# Patient Record
Sex: Male | Born: 1990 | Race: Black or African American | Hispanic: No | Marital: Single | Smoking: Never smoker
Health system: Southern US, Community
[De-identification: ages and names within clinical notes are randomized; demographics above are authoritative.]

## PROBLEM LIST (undated history)

## (undated) DIAGNOSIS — J302 Other seasonal allergic rhinitis: Secondary | ICD-10-CM

## (undated) HISTORY — PX: HERNIA REPAIR: SHX51

---
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
## 2010-10-27 LAB — COMPREHENSIVE METABOLIC PANEL
AST: 25
Albumin: 4
BUN: 7
Chloride: 104
Creatinine, Ser: 0.87
Potassium: 4
Total Protein: 7.1

## 2010-10-27 LAB — URINALYSIS, ROUTINE W REFLEX MICROSCOPIC
Bilirubin Urine: NEGATIVE
Glucose, UA: NEGATIVE
Hgb urine dipstick: NEGATIVE
Specific Gravity, Urine: 1.008

## 2013-10-15 ENCOUNTER — Emergency Department (HOSPITAL_COMMUNITY)
Admission: EM | Admit: 2013-10-15 | Discharge: 2013-10-15 | Disposition: A | Discharge status: Home / Self Care | Payer: No Typology Code available for payment source | Attending: Emergency Medicine | Admitting: Emergency Medicine

## 2013-10-15 ENCOUNTER — Encounter (HOSPITAL_COMMUNITY): Payer: Self-pay | Admitting: Emergency Medicine

## 2013-10-15 DIAGNOSIS — Y92410 Unspecified street and highway as the place of occurrence of the external cause: Secondary | ICD-10-CM | POA: Diagnosis not present

## 2013-10-15 DIAGNOSIS — S199XXA Unspecified injury of neck, initial encounter: Secondary | ICD-10-CM | POA: Insufficient documentation

## 2013-10-15 DIAGNOSIS — Y9389 Activity, other specified: Secondary | ICD-10-CM | POA: Insufficient documentation

## 2013-10-15 DIAGNOSIS — S60311A Abrasion of right thumb, initial encounter: Secondary | ICD-10-CM | POA: Diagnosis not present

## 2013-10-15 DIAGNOSIS — M542 Cervicalgia: Secondary | ICD-10-CM

## 2013-10-15 DIAGNOSIS — S0990XA Unspecified injury of head, initial encounter: Secondary | ICD-10-CM | POA: Diagnosis not present

## 2013-10-15 DIAGNOSIS — G4489 Other headache syndrome: Secondary | ICD-10-CM

## 2013-10-15 HISTORY — DX: Other seasonal allergic rhinitis: J30.2

## 2013-10-15 MED ORDER — HYDROCODONE-ACETAMINOPHEN 5-325 MG PO TABS: 1.0000 | ORAL_TABLET | ORAL | Status: DC | PRN

## 2013-10-15 MED ORDER — METHOCARBAMOL 500 MG PO TABS: 500.0000 mg | ORAL_TABLET | Freq: Two times a day (BID) | ORAL | Status: DC
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
## 2013-10-15 NOTE — Discharge Instructions (Signed)
Take the prescribed medication as directed.  Use caution with vicodin, may make you drowsy. Return to the ED for new or worsening symptoms.

## 2013-10-18 NOTE — ED Provider Notes (Signed)
Medical screening examination/treatment/procedure(s) were performed by non-physician practitioner and as supervising physician I was immediately available for consultation/collaboration.   EKG Interpretation None        Matthew Gentry, MD 10/18/13 0705 

## 2014-05-30 ENCOUNTER — Ambulatory Visit: Payer: Self-pay | Admitting: Family

## 2014-05-30 DIAGNOSIS — Z0289 Encounter for other administrative examinations: Secondary | ICD-10-CM

## 2017-12-18 ENCOUNTER — Encounter (HOSPITAL_COMMUNITY): Payer: Self-pay | Admitting: Emergency Medicine

## 2017-12-18 ENCOUNTER — Emergency Department (HOSPITAL_COMMUNITY): Payer: BLUE CROSS/BLUE SHIELD

## 2017-12-18 ENCOUNTER — Emergency Department (HOSPITAL_COMMUNITY)
Admission: EM | Admit: 2017-12-18 | Discharge: 2017-12-18 | Disposition: A | Discharge status: Home / Self Care | Payer: BLUE CROSS/BLUE SHIELD | Attending: Emergency Medicine | Admitting: Emergency Medicine

## 2017-12-18 DIAGNOSIS — Y92410 Unspecified street and highway as the place of occurrence of the external cause: Secondary | ICD-10-CM | POA: Diagnosis not present

## 2017-12-18 DIAGNOSIS — Y999 Unspecified external cause status: Secondary | ICD-10-CM | POA: Insufficient documentation

## 2017-12-18 DIAGNOSIS — S6991XA Unspecified injury of right wrist, hand and finger(s), initial encounter: Secondary | ICD-10-CM | POA: Diagnosis present

## 2017-12-18 DIAGNOSIS — S62291A Other fracture of first metacarpal bone, right hand, initial encounter for closed fracture: Secondary | ICD-10-CM | POA: Diagnosis not present

## 2017-12-18 DIAGNOSIS — S0031XA Abrasion of nose, initial encounter: Secondary | ICD-10-CM | POA: Insufficient documentation

## 2017-12-18 DIAGNOSIS — H53143 Visual discomfort, bilateral: Secondary | ICD-10-CM | POA: Diagnosis not present

## 2017-12-18 DIAGNOSIS — Y9389 Activity, other specified: Secondary | ICD-10-CM | POA: Diagnosis not present

## 2017-12-18 MED ORDER — HYDROCODONE-ACETAMINOPHEN 5-325 MG PO TABS: 1.0000 | ORAL_TABLET | Freq: Four times a day (QID) | ORAL | 0 refills | Status: AC | PRN

## 2017-12-18 MED ORDER — TOBRAMYCIN 0.3 % OP SOLN: 1.0000 [drp] | OPHTHALMIC | 0 refills | Status: AC

## 2017-12-18 MED ORDER — FLUORESCEIN SODIUM 1 MG OP STRP
1.0000 | ORAL_STRIP | Freq: Once | OPHTHALMIC | Status: AC
  Administered 2017-12-18: 1 via OPHTHALMIC
  Filled 2017-12-18: qty 1

## 2017-12-18 MED ORDER — HYDROCODONE-ACETAMINOPHEN 5-325 MG PO TABS
1.0000 | ORAL_TABLET | Freq: Once | ORAL | Status: AC
  Administered 2017-12-18: 1 via ORAL
  Filled 2017-12-18: qty 1

## 2017-12-18 MED ORDER — TETRACAINE HCL 0.5 % OP SOLN
2.0000 [drp] | Freq: Once | OPHTHALMIC | Status: AC
  Administered 2017-12-18: 2 [drp] via OPHTHALMIC
  Filled 2017-12-18: qty 4

## 2017-12-18 NOTE — ED Notes (Signed)
Bed: WTR7 Expected date:  Expected time:  Means of arrival:  Comments: 
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
## 2017-12-18 NOTE — Discharge Instructions (Addendum)
Use the eyedrop prescribed as directed.  Take Tylenol for mild pain pain medicine prescribed for bad pain.  Do not take Tylenol together with the pain medicine prescribed as the combination can be dangerous to your liver.  Call Dr.Shah's office if your eye is not improving in the next 3 to 4 days.  Call Dr.Landau's office tomorrow to schedule an office appointment within the next 1 week.  Tell office staff that you had x-rays performed here
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
